# Patient Record
Sex: Male | Born: 1995 | Hispanic: Yes | Marital: Single
Health system: Midwestern US, Academic
[De-identification: ages and names within clinical notes are randomized; demographics above are authoritative.]

---

## 2017-07-20 ENCOUNTER — Encounter
Admit: 2017-07-20 | Discharge: 2017-07-20 | Payer: BC Managed Care – PPO | Primary: Student in an Organized Health Care Education/Training Program

## 2017-08-11 ENCOUNTER — Ambulatory Visit
Admit: 2017-08-11 | Discharge: 2017-08-12 | Payer: BC Managed Care – PPO | Primary: Student in an Organized Health Care Education/Training Program

## 2017-08-11 ENCOUNTER — Ambulatory Visit
Admit: 2017-08-11 | Discharge: 2017-08-11 | Payer: BC Managed Care – PPO | Primary: Student in an Organized Health Care Education/Training Program

## 2017-08-11 ENCOUNTER — Encounter
Admit: 2017-08-11 | Discharge: 2017-08-11 | Payer: BC Managed Care – PPO | Primary: Student in an Organized Health Care Education/Training Program

## 2017-08-11 DIAGNOSIS — Z7689 Persons encountering health services in other specified circumstances: Principal | ICD-10-CM

## 2017-08-11 DIAGNOSIS — I10 Essential (primary) hypertension: Principal | ICD-10-CM

## 2017-08-11 DIAGNOSIS — H9313 Tinnitus, bilateral: ICD-10-CM

## 2017-08-11 DIAGNOSIS — R222 Localized swelling, mass and lump, trunk: ICD-10-CM

## 2017-08-11 DIAGNOSIS — F419 Anxiety disorder, unspecified: ICD-10-CM

## 2017-08-11 LAB — COMPREHENSIVE METABOLIC PANEL
Lab: 0.4 mg/dL (ref 0.3–1.2)
Lab: 0.7 mg/dL (ref 0.4–1.24)
Lab: 10 mg/dL (ref 8.5–10.6)
Lab: 106 MMOL/L (ref 98–110)
Lab: 138 MMOL/L (ref 137–147)
Lab: 17 mg/dL (ref 7–25)
Lab: 4 MMOL/L (ref 3.5–5.1)
Lab: 7.9 g/dL (ref 6.0–8.0)
Lab: 97 mg/dL (ref 70–100)
Lab: >60 mL/min (ref >60)
Lab: >60 mL/min (ref >60)
Lab: 8 (ref 3–12)

## 2017-08-11 NOTE — Progress Notes
Date of Service: 08/11/2017    Subjective:             Paul Hale is a 22 y.o. male.    History of Present Illness  Paul Hale present to clinic to establish care.  Patient states he was previously seen at [facility] and is moving care to [place] to be closer to home.     Multiple Current concerns:  Patient states he had a paint can fall on his head about 2 months ago.  He states he was evaluated at [facility] after the accident.  He states he's had concussive symptoms inclduing lightheadedness and dizziness since the episode.  He states he is currently undergoing vestibular rehab which has mildly improved his symptoms. He denies any acute episodes of syncope, nausea, vomiting, or intractable headaches.  Patient sates he;s also noticed a lump in his R chest area.  He denies any surrounding edema or erythema.  He states he noticed the lump a couple of months ago.  He states he occasionally notices it causing some discomfort with movement of his R upper extremity.  He denies any known injury to his chest wall or upper extremity. He denies any known history of breast cancer in the family.    Past Medical History:   Diagnosis Date   ??? Hypertension      Past Surgical History:   Procedure Laterality Date   ??? HX SEPTOPLASTY  07/2016     Family History   Problem Relation Age of Onset   ??? Hypertension Mother    ??? Hypertension Father    ??? Diabetes Father    ??? Heart Disease Father      Social History     Social History   ??? Marital status: Single     Spouse name: N/A   ??? Number of children: N/A   ??? Years of education: N/A     Occupational History   ???  [employer]     Social History Main Topics   ??? Smoking status: Former Smoker     Types: Cigarettes     Quit date: 03/2015   ??? Smokeless tobacco: Never Used   ??? Alcohol use 3.6 oz/week     6 Cans of beer per week   ??? Drug use: No   ??? Sexual activity: Yes     Partners: Female     Other Topics Concern   ??? Not on file     Social History Narrative   ??? No narrative on file          Review of Systems Constitutional: Negative for chills, fatigue, fever and unexpected weight change.   HENT: Positive for tinnitus (chronic, high pitched). Negative for congestion, rhinorrhea and sore throat. Hearing loss: Patinet unsure but is concerned about hearing loss.    Eyes: Negative for pain and visual disturbance.   Respiratory: Negative for cough, chest tightness and shortness of breath.    Cardiovascular: Negative for chest pain and palpitations.   Gastrointestinal: Negative for abdominal pain, constipation, diarrhea, nausea and vomiting.   Endocrine: Negative for polydipsia and polyuria.   Genitourinary: Negative for dysuria, frequency and urgency.   Musculoskeletal: Negative for arthralgias, back pain, myalgias and neck pain.   Skin: Negative for rash and wound.   Neurological: Positive for dizziness and light-headedness. Negative for syncope, weakness and headaches.   Hematological: Does not bruise/bleed easily.   Psychiatric/Behavioral: Positive for dysphoric mood. Negative for self-injury and suicidal ideas. The patient is nervous/anxious.  Objective:         ??? hydroCHLOROthiazide (HYDRODIURIL) 25 mg tablet Take 25 mg by mouth every morning.     Vitals:    08/11/17 0852 08/11/17 0947   BP: 154/75 148/86   Pulse: 80 76   Resp: 14    Temp: 36.7 ???C (98.1 ???F)    TempSrc: Oral    SpO2: 100%    Weight: 93.8 kg (206 lb 12.8 oz)    Height: 177.8 cm (70)      Body mass index is 29.67 kg/m???.     Physical Exam   Constitutional: He is oriented to person, place, and time. He appears well-developed and well-nourished. No distress.   HENT:   Head: Normocephalic and atraumatic.   Right Ear: External ear normal.   Left Ear: External ear normal.   Nose: Nose normal.   Mouth/Throat: Oropharynx is clear and moist.   Rinne (air>bone) and webber (midline)    Eyes: Pupils are equal, round, and reactive to light. Conjunctivae and EOM are normal.   Neck: Normal range of motion. Neck supple. No thyromegaly present. Cardiovascular: Normal rate, regular rhythm, normal heart sounds and intact distal pulses.    No murmur heard.  Pulmonary/Chest: Effort normal and breath sounds normal. No respiratory distress. He has no wheezes. He exhibits no tenderness.   Small, non-tender fibrous bundle palpated on R chest at 10 o'clock region   Abdominal: Soft. Bowel sounds are normal. He exhibits no distension. There is no tenderness.   Musculoskeletal: Normal range of motion. He exhibits no edema or tenderness.   Neurological: He is alert and oriented to person, place, and time. No cranial nerve deficit.   Skin: Skin is warm and dry. No rash noted. He is not diaphoretic. No erythema.   Psychiatric: He has a normal mood and affect. His speech is normal and behavior is normal. Thought content normal.   PHQ 9 score: 15  GAD 7 score: 20   Vitals reviewed.       Assessment and Plan:    Paul Hale was seen today for establish care.    Diagnoses and all orders for this visit:    Encounter to establish care  - 21 y/o M w/ a PMH of hypertension presents to establish care  - PMH, PSH, FH, SH, Medications, and allergies reviewed and updated  - Will request records from previous provider    Essential hypertension  - Elevated BP in clinic today, patient reports not taking medication this morning  - Counseled on importance of BP control and medication compliance  - Counseled on benefit of DASH diet and weight loss  - RTC in 2 weeks for BP check.  Warning signs and return precautions for hypertension discussed  -     COMPREHENSIVE METABOLIC PANEL; Future; Expected date: 08/11/2017    Bilateral tinnitus  -Patient reported concerns for hearing loss and bilateral tinnitus  -Rinne and Weber test unremarkable in clinic  -Patient works on the [employer] at [employer], high occupational risk for hearing concerns  -Referral to audiology for hearing test  -     AMB REFERRAL TO AUDIOLOGY    Mass of right chest wall - Unclear cause of mass noted on physical exam,  ddx includes isolated lymphadenopathy, tumor, pectoral muscle injury  - [place] chest wall ordered for further evaluation  -     US EXAM CHEST (WALL AND UB); Future; Expected date: 08/11/2017    Anxiety and depression  -  Concerning PHQ 9 and gad 7 scores  - Brief counseling in clinic for anxiety and depression, recommended patient return to clinic in 2 weeks to further discuss treatment options  - We will review records in the meantime to determine if previous attempts were made for treatment  - Warning signs and return precautions for increasing anxiety or depression discussed    Patient discussed with Dr. Harvest Dark, MD  Family Medicine, PGY-3  Pager *[phone]

## 2017-08-22 NOTE — Progress Notes
I have reviewed the case with Dr. Majid at the time of the visit and agree with the evaluation and plan as documented by the resident.

## 2017-08-28 ENCOUNTER — Ambulatory Visit
Admit: 2017-08-28 | Discharge: 2017-08-29 | Payer: BC Managed Care – PPO | Primary: Student in an Organized Health Care Education/Training Program

## 2017-08-29 DIAGNOSIS — R222 Localized swelling, mass and lump, trunk: Principal | ICD-10-CM

## 2017-08-30 ENCOUNTER — Encounter
Admit: 2017-08-30 | Discharge: 2017-08-30 | Payer: BC Managed Care – PPO | Primary: Student in an Organized Health Care Education/Training Program

## 2017-08-30 DIAGNOSIS — R222 Localized swelling, mass and lump, trunk: Principal | ICD-10-CM

## 2017-09-05 ENCOUNTER — Encounter
Admit: 2017-09-05 | Discharge: 2017-09-05 | Payer: BC Managed Care – PPO | Primary: Student in an Organized Health Care Education/Training Program

## 2017-09-05 ENCOUNTER — Ambulatory Visit
Admit: 2017-09-05 | Discharge: 2017-09-05 | Payer: BC Managed Care – PPO | Primary: Student in an Organized Health Care Education/Training Program

## 2017-09-05 DIAGNOSIS — I10 Essential (primary) hypertension: Principal | ICD-10-CM

## 2017-09-05 DIAGNOSIS — F419 Anxiety disorder, unspecified: Principal | ICD-10-CM

## 2017-09-05 NOTE — Progress Notes
Date of Service: 09/05/2017    Subjective:             Paul Hale is a 21 y.o. male.    History of Present Illness  Paul Hale presents to clinic for follow up.    He was last seen in clinic on 11/8.  Patient states he continues to have symptoms of anxiety and depression.  He states he finds himself worrying about various life stressors.  He states he feels anxious about most daily activities. He states his anxiety affects his ability to complete tasks, and he feels as if there's never enough time in the day.  He denies any negative thoughts about himself or thoughts/ attempts to harm himself or others.  He states he also has difficulty falling asleep.  He states he usually gets to bed by 10 but does not fall asleep until 11:30-12.  He and his girlfriend in the room deny any concerns for apnea or snoring.  He states he usually feels tired most days which also does not help his ability to complete tasks.  He states he would be interested in talking to a counselor about his symptoms, but he states he is not interested in starting medications. He denies any other acute concerns today.    Past Medical History:   Diagnosis Date   ??? Hypertension      Social History     Social History   ??? Marital status: Single     Spouse name: N/A   ??? Number of children: N/A   ??? Years of education: N/A     Occupational History   ???  [employer]     Social History Main Topics   ??? Smoking status: Former Smoker     Types: Cigarettes     Quit date: 03/2015   ??? Smokeless tobacco: Never Used   ??? Alcohol use 3.6 oz/week     6 Cans of beer per week   ??? Drug use: No   ??? Sexual activity: Yes     Partners: Female     Other Topics Concern   ??? Not on file     Social History Narrative   ??? No narrative on file     Family History   Problem Relation Age of Onset   ??? Hypertension Mother    ??? Hypertension Father    ??? Diabetes Father    ??? Heart Disease Father         Review of Systems   Constitutional: Negative for chills, fatigue, fever and unexpected weight change.   Respiratory: Negative for cough, chest tightness and shortness of breath.    Cardiovascular: Negative for chest pain and palpitations.   Gastrointestinal: Negative for abdominal pain, constipation, diarrhea, nausea and vomiting.   Neurological: Negative for syncope and headaches.   Psychiatric/Behavioral: Positive for decreased concentration, dysphoric mood and sleep disturbance. Negative for agitation, behavioral problems, confusion, hallucinations, self-injury and suicidal ideas. The patient is nervous/anxious.          Objective:         ??? hydroCHLOROthiazide (HYDRODIURIL) 25 mg tablet Take 25 mg by mouth every morning.     Vitals:    09/05/17 1052   BP: 133/74   Pulse: 89   Resp: 16   SpO2: 98%   Weight: 94.8 kg (209 lb)   Height: 177.8 cm (70)     Body mass index is 29.99 kg/m???.     Physical Exam   Constitutional: He is oriented  to person, place, and time. He appears well-developed and well-nourished. No distress.   HENT:   Head: Normocephalic and atraumatic.   Mouth/Throat: Oropharynx is clear and moist.   Cardiovascular: Normal rate, regular rhythm, normal heart sounds and intact distal pulses.    No murmur heard.  Pulmonary/Chest: Effort normal and breath sounds normal. No respiratory distress. He exhibits no tenderness.   Neurological: He is alert and oriented to person, place, and time. No sensory deficit. He exhibits normal muscle tone.   Skin: He is not diaphoretic.   Psychiatric: His speech is normal and behavior is normal. Thought content normal. His mood appears anxious. His affect is not angry and not inappropriate. He does not exhibit a depressed mood. He expresses no homicidal and no suicidal ideation. He expresses no suicidal plans and no homicidal plans.   PHQ 9 score: 20  GAD 7 score: 17   Vitals reviewed.       Assessment and Plan:    [name] was seen today for follow up.    Diagnoses and all orders for this visit:    Anxiety and depression - Chronic condition, noted during previous OV as well  - Patient interested in counseling, referred to [facility]  - Brief counseling in clinic today, recommended keeping journal of anxiety triggers  - Counseled on sleep hygiene and warning signs for apnea  - Although formal questionnaire was not completed, symptoms are concerning for adult ADHD leading to his anxiety and depression.  Recommend formal evaluation at either next visit or by [facility]  - Warning signs and return precautions for significant anxiety and depression discussed  -     AMB REFERRAL TO BEHAVIORAL HEALTH    Essential hypertension  - BP wnl in clinic today, previously elevated at last clinic appointment.  Goal < 140/90  - Continue HCTZ at current dose    Patient discussed with Dr. Oliver Barre, MD  Family Medicine, PGY-3  Pager *[phone]

## 2017-09-07 NOTE — Progress Notes
Attestation:    See resident note for details of Paul RungSaul Cargle's visit. The case was reviewed with the resident at the time of the visit. I agree with the care as documented.    Staff Name: Raford Pitcherahira Layia Walla, MD             Time: September 07, 2017

## 2017-09-20 ENCOUNTER — Encounter
Admit: 2017-09-20 | Discharge: 2017-09-20 | Payer: BC Managed Care – PPO | Primary: Student in an Organized Health Care Education/Training Program

## 2017-10-17 ENCOUNTER — Ambulatory Visit
Admit: 2017-10-17 | Discharge: 2017-10-17 | Payer: BC Managed Care – PPO | Primary: Student in an Organized Health Care Education/Training Program

## 2017-10-17 ENCOUNTER — Encounter
Admit: 2017-10-17 | Discharge: 2017-10-17 | Payer: BC Managed Care – PPO | Primary: Student in an Organized Health Care Education/Training Program

## 2017-10-17 DIAGNOSIS — Q678 Other congenital deformities of chest: Principal | ICD-10-CM

## 2017-10-17 DIAGNOSIS — I10 Essential (primary) hypertension: Principal | ICD-10-CM

## 2018-01-12 ENCOUNTER — Encounter
Admit: 2018-01-12 | Discharge: 2018-01-12 | Payer: BC Managed Care – PPO | Primary: Student in an Organized Health Care Education/Training Program

## 2018-04-11 ENCOUNTER — Encounter
Admit: 2018-04-11 | Discharge: 2018-04-11 | Payer: BC Managed Care – PPO | Primary: Student in an Organized Health Care Education/Training Program

## 2018-07-24 ENCOUNTER — Encounter
Admit: 2018-07-24 | Discharge: 2018-07-24 | Payer: BC Managed Care – PPO | Primary: Student in an Organized Health Care Education/Training Program

## 2019-03-22 ENCOUNTER — Encounter: Admit: 2019-03-22 | Discharge: 2019-03-22 | Primary: Student in an Organized Health Care Education/Training Program

## 2019-06-18 ENCOUNTER — Encounter
Admit: 2019-06-18 | Discharge: 2019-06-18 | Payer: BC Managed Care – PPO | Primary: Student in an Organized Health Care Education/Training Program

## 2019-06-18 ENCOUNTER — Ambulatory Visit
Admit: 2019-06-18 | Discharge: 2019-06-19 | Payer: BC Managed Care – PPO | Primary: Student in an Organized Health Care Education/Training Program

## 2019-06-18 MED ORDER — SILDENAFIL 50 MG PO TAB
50 mg | ORAL_TABLET | ORAL | 0 refills | 25.00000 days | Status: AC | PRN
Start: 2019-06-18 — End: ?

## 2019-06-18 MED ORDER — HYDROCORTISONE 1 % TP CREA
Freq: Two times a day (BID) | TOPICAL | 0 refills | 30.00000 days | Status: AC
Start: 2019-06-18 — End: ?

## 2019-06-18 NOTE — Progress Notes
Subjective:       History of Present Illness  Paul Hale is a 23 y.o. male. Patient presenting to clinic for concern for yeast infection.    HPI  2 months ago, patient developed red polka-dots on the shaft and head with irritation and stimulation with a penis pump (for erectile dysfunction), these were present intermittently. Since that time, lesions have become persistent and developed a white pus and become odorous. Was seen at [facility] 1 month ago and given topical clotrimazole, tried this for 1 month, but this did not help. Girlfriend was diagnosed with a yeast infection.  Tested for STD's at [facility] before [facility] visit, negative.       Review of Systems   Constitutional: Negative for chills and fever.   Respiratory: Negative for shortness of breath.    Cardiovascular: Negative for chest pain.   Gastrointestinal: Negative for constipation, diarrhea, nausea and vomiting.   Genitourinary: Positive for dysuria, penile pain and penile swelling. Negative for discharge, flank pain, frequency, hematuria and scrotal swelling.   Neurological: Negative for dizziness and headaches.   Psychiatric/Behavioral: Negative for suicidal ideas. The patient is nervous/anxious.      Objective:         ? acetaminophen (TYLENOL PO) Take  by mouth.   ? hydrocortisone 1 % topical cream Apply  topically to affected area twice daily.   ? sildenafiL (VIAGRA) 50 mg tablet Take one tablet by mouth as Needed for Erectile dysfunction. Indications: the inability to have an erection     Vitals:    06/18/19 1454   BP: (!) 141/70   Pulse: 79   Resp: 16   SpO2: 100%   Weight: 84.4 kg (186 lb)   Height: 175.3 cm (69)     Body mass index is 27.47 kg/m?Marland Kitchen     Physical Exam  Constitutional:       General: He is not in acute distress.     Appearance: Normal appearance.   Cardiovascular:      Rate and Rhythm: Normal rate and regular rhythm.      Heart sounds: Normal heart sounds. No murmur. No friction rub. No gallop. Pulmonary:      Effort: No respiratory distress.   Abdominal:      General: Bowel sounds are normal.      Palpations: Abdomen is soft.      Tenderness: There is no abdominal tenderness.   Genitourinary:     Penis: Uncircumcised. No phimosis or paraphimosis.       Comments: Multiple pinpoint red non-tender, non-pruritic papules on glans  Musculoskeletal:         General: No swelling.   Skin:     General: Skin is warm and dry.   Neurological:      Mental Status: He is alert.   Psychiatric:         Mood and Affect: Mood is anxious.          Assessment and Plan:  Erectile dysfunction, unspecified erectile dysfunction type  Generalized Anxiety  -     AMB REFERRAL TO BEHAVIORAL HEALTH  -     sildenafiL (VIAGRA) 50 mg tablet; Take one tablet by mouth as Needed for Erectile dysfunction. Indications: the inability to have an erection  - Admits to anxiety but hasn't work with psychology in past    Penile lesion  - Appears to be capillaries vs inflammation, NOT concerned for balanitis, HSV, sphyilis or gonorrhea/chlamydia  - Symptoms appeared after  using penile pump for E.D.  - Failed Clotrimazole and unknown oral medication (Fluconazole?)  - Reviewed STI panel from OSH (negative), GF was negative as well  -     hydrocortisone 1 % topical cream; Apply  topically to affected area twice daily.  - Advised keeping area cool and dry, wearing looser underwear, avoiding penile pump    Patient seen and discussed with Dr. Suzi Roots.     Patient to return on as needed basis if symptoms worsen.    ATTESTATION    I personally performed or re-performed the history, physical exam and treatment plan for the E/M. I discussed the case with the Medical Student, and concur with the Medical Student documentation of history, physical exam and treatment plan unless otherwise noted.    Resident name:  Alonna Minium, DO Date:  06/18/2019

## 2019-07-04 ENCOUNTER — Encounter
Admit: 2019-07-04 | Discharge: 2019-07-04 | Payer: BC Managed Care – PPO | Primary: Student in an Organized Health Care Education/Training Program

## 2019-07-17 ENCOUNTER — Encounter
Admit: 2019-07-17 | Discharge: 2019-07-17 | Payer: BC Managed Care – PPO | Primary: Student in an Organized Health Care Education/Training Program

## 2019-07-17 NOTE — Telephone Encounter
Called pt when he did not present to telehealth session. Home number not in service, left voicemail on mobile number, included [facility] clinic phone contact if pt interested in scheduling services.

## 2020-07-04 ENCOUNTER — Encounter
Admit: 2020-07-04 | Discharge: 2020-07-04 | Payer: BC Managed Care – PPO | Primary: Student in an Organized Health Care Education/Training Program

## 2020-07-21 ENCOUNTER — Encounter
Admit: 2020-07-21 | Discharge: 2020-07-21 | Payer: BC Managed Care – PPO | Primary: Student in an Organized Health Care Education/Training Program

## 2020-07-21 NOTE — Telephone Encounter
RN called to find more information related to appointment with Dr. Jeanella Craze. Unable to reach patient. LVM for call back.

## 2020-07-22 ENCOUNTER — Encounter
Admit: 2020-07-22 | Discharge: 2020-07-22 | Payer: BC Managed Care – PPO | Primary: Student in an Organized Health Care Education/Training Program

## 2020-07-22 ENCOUNTER — Ambulatory Visit
Admit: 2020-07-22 | Discharge: 2020-07-22 | Payer: BC Managed Care – PPO | Primary: Student in an Organized Health Care Education/Training Program

## 2020-07-22 DIAGNOSIS — I10 Essential (primary) hypertension: Secondary | ICD-10-CM

## 2020-07-22 DIAGNOSIS — J310 Chronic rhinitis: Secondary | ICD-10-CM

## 2020-07-22 DIAGNOSIS — J029 Acute pharyngitis, unspecified: Secondary | ICD-10-CM

## 2020-07-22 MED ORDER — FLUTICASONE PROPIONATE 50 MCG/ACTUATION NA SPSN
2 | Freq: Every day | NASAL | 3 refills | 60.00000 days | Status: AC
Start: 2020-07-22 — End: ?

## 2020-07-22 NOTE — Patient Instructions
We discussed having you start an over the counter nasal steroid spray (Nasacort -triamcinolone or Flonase -fluticasone).  I recommend two sprays each nostril once day - typically the best time is 30 min prior to bedtime.   Remember to aim the nasal steroid spray towards the outer corner of your eye and sniff gently when spraying.  This spray can take 4-6 weeks of consistent use to have it's full effect.

## 2020-07-25 ENCOUNTER — Encounter
Admit: 2020-07-25 | Discharge: 2020-07-25 | Payer: BC Managed Care – PPO | Primary: Student in an Organized Health Care Education/Training Program

## 2020-07-25 DIAGNOSIS — I10 Essential (primary) hypertension: Secondary | ICD-10-CM

## 2021-01-20 ENCOUNTER — Encounter
Admit: 2021-01-20 | Discharge: 2021-01-20 | Payer: BC Managed Care – PPO | Primary: Student in an Organized Health Care Education/Training Program

## 2021-01-20 ENCOUNTER — Emergency Department: Admit: 2021-01-20 | Discharge: 2021-01-20 | Payer: BC Managed Care – PPO

## 2021-01-20 DIAGNOSIS — I10 Essential (primary) hypertension: Secondary | ICD-10-CM

## 2021-01-20 LAB — URINALYSIS DIPSTICK REFLEX TO CULTURE
Lab: NEGATIVE K/UL (ref 0–0.80)
Lab: NEGATIVE K/UL — ABNORMAL HIGH (ref 3–12)
Lab: NEGATIVE MMOL/L (ref 21–30)
Lab: NEGATIVE U/L (ref 7–40)
Lab: NEGATIVE U/L — ABNORMAL LOW (ref 25–110)
Lab: NEGATIVE g/dL — ABNORMAL HIGH (ref 3.5–5.0)
Lab: NEGATIVE mL/min (ref 1.0–4.8)

## 2021-01-20 LAB — CBC AND DIFF
Lab: 0 K/UL (ref 0–0.20)
Lab: 0 K/UL (ref 0–0.45)
Lab: 11 K/UL — ABNORMAL HIGH (ref 4.5–11.0)
Lab: 18 (ref <20.7)

## 2021-01-20 LAB — COMPREHENSIVE METABOLIC PANEL
Lab: 102 MMOL/L (ref 98–110)
Lab: 138 MMOL/L (ref 137–147)
Lab: 89 mg/dL (ref 70–100)

## 2021-01-20 LAB — POC LACTATE: Lab: 1.5 MMOL/L (ref 0.5–2.0)

## 2021-01-20 LAB — URINALYSIS MICROSCOPIC REFLEX TO CULTURE

## 2021-01-20 LAB — LIPASE: Lab: 17 U/L (ref 11–82)

## 2021-01-20 MED ORDER — ACETAMINOPHEN 325 MG PO TAB
650 mg | Freq: Once | ORAL | 0 refills | Status: CP
Start: 2021-01-20 — End: ?
  Administered 2021-01-21: 02:00:00 650 mg via ORAL

## 2021-01-20 MED ORDER — ACETAMINOPHEN/LIDOCAINE/ANTACID DS(#) 1:1:3  PO SUSP
30 mL | Freq: Once | ORAL | 0 refills | Status: CP
Start: 2021-01-20 — End: ?
  Administered 2021-01-21: 01:00:00 30 mL via ORAL

## 2021-01-20 MED ORDER — ACETAMINOPHEN 500 MG PO TAB
1000 mg | Freq: Once | ORAL | 0 refills | Status: DC
Start: 2021-01-20 — End: 2021-01-21

## 2021-01-20 MED ORDER — PANTOPRAZOLE 40 MG IV SOLR
40 mg | Freq: Once | INTRAVENOUS | 0 refills | Status: CP
Start: 2021-01-20 — End: ?
  Administered 2021-01-21: 02:00:00 40 mg via INTRAVENOUS

## 2021-01-20 MED ORDER — IOHEXOL 350 MG IODINE/ML IV SOLN
100 mL | Freq: Once | INTRAVENOUS | 0 refills | Status: CP
Start: 2021-01-20 — End: ?
  Administered 2021-01-21: 03:00:00 100 mL via INTRAVENOUS

## 2021-01-20 MED ORDER — SODIUM CHLORIDE 0.9 % IJ SOLN
50 mL | Freq: Once | INTRAVENOUS | 0 refills | Status: CP
Start: 2021-01-20 — End: ?
  Administered 2021-01-21: 03:00:00 50 mL via INTRAVENOUS

## 2021-01-20 NOTE — ED Notes
25 y.o. Male presents to ED results waiting due to CC of abdominal pain. Pt indicates that pain is in his upper abdominal area. Pt currently rates pain 10/10. Pt reportedly seen at [facility] for same CC. Pt reports that he was dx w/ "fluid" on his gallbladder. Pt ambulates w/ a steady gate and in NAD.

## 2021-01-23 ENCOUNTER — Encounter
Admit: 2021-01-23 | Discharge: 2021-01-23 | Payer: BC Managed Care – PPO | Primary: Student in an Organized Health Care Education/Training Program

## 2021-01-23 NOTE — Telephone Encounter
ED Discharge Follow Up  Reached patient: Yes  Admission Information  Hospital Name : [facility]  ED Admission Date: 01/20/21 ED Discharge Date: 01/21/21 Admission Diagnosis: Pain, abd  Discharge Diagnosis Abdominal pain, unspecified abdominal location  Hospital Services: Unplanned  Today's call is 2 (calendar) days post discharge    Discharge Instruction Review  Did patient receive and understand discharge instructions? Yes  Are there concerns regarding the patient?s ADL?s ? No    Medication Reconciliation  Changes to pre-ED visit medications? No  Were new prescriptions filled?N/A  Meds reviewed and reconciled? No  ? acetaminophen (TYLENOL PO) Take  by mouth.   ? fexofenadine (ALLEGRA) 180 mg tablet Take 180 mg by mouth daily.   ? fluticasone propionate (FLONASE ALLERGY RELIEF) 50 mcg/actuation nasal spray, suspension Apply two sprays to each nostril as directed daily. Shake bottle gently before using.   ? hydrocortisone 1 % topical cream Apply  topically to affected area twice daily.   ? sildenafiL (VIAGRA) 50 mg tablet Take one tablet by mouth as Needed for Erectile dysfunction. Indications: the inability to have an erection         Understanding Condition  Having any current symptoms? Yes, Pain/discomfort; he states he still has some abd pain but not as bad.  He denies any fever, N/V.  He requested to be transferred to [facility] for f/u appt, and will contact PCP office for appt at later time.  Patient understands when to seek additional medical care? Yes  Is patient aware of [facility] locations?  Urgent Care appropriate for this diagnosis? No  Other instructions provided:     Scheduling Follow-up Appointment  Upcoming appointment date and time and with whom scheduled: No future appointments.  When was patient?s last PCP visit: 06/18/2019  PCP primary location: [facility]  PCP appointment scheduled? No, will contact office at later time  Specialist appointment scheduled? No, transferred to [facility] for appt  Both PCP and Specialist appointment scheduled: No  Is assistance with transportation needed? No  MyChart message sent? Active in MyChart. No message sent.     ED Communication   Did Pt call Clinic prior to going to ED? No  Reason patient went to ED: Fear of having a critical medical condition    Paul Hale

## 2021-01-23 NOTE — Telephone Encounter
Noted.Will Follow As Needed.

## 2021-01-28 ENCOUNTER — Encounter
Admit: 2021-01-28 | Discharge: 2021-01-28 | Payer: BC Managed Care – PPO | Primary: Student in an Organized Health Care Education/Training Program

## 2021-01-28 ENCOUNTER — Ambulatory Visit
Admit: 2021-01-28 | Discharge: 2021-01-29 | Payer: BC Managed Care – PPO | Primary: Student in an Organized Health Care Education/Training Program

## 2021-01-28 DIAGNOSIS — K6289 Other specified diseases of anus and rectum: Secondary | ICD-10-CM

## 2021-01-28 DIAGNOSIS — F4310 Post-traumatic stress disorder, unspecified: Secondary | ICD-10-CM

## 2021-01-28 DIAGNOSIS — R1084 Generalized abdominal pain: Principal | ICD-10-CM

## 2021-01-28 DIAGNOSIS — I10 Essential (primary) hypertension: Secondary | ICD-10-CM

## 2021-01-28 MED ORDER — HYDROCORTISONE 2.5 % TP CRPE
Freq: Two times a day (BID) | TOPICAL | 1 refills | 30.00000 days | Status: AC
Start: 2021-01-28 — End: ?
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
## 2021-02-02 ENCOUNTER — Encounter
Admit: 2021-02-02 | Discharge: 2021-02-02 | Payer: BC Managed Care – PPO | Primary: Student in an Organized Health Care Education/Training Program

## 2021-02-02 ENCOUNTER — Ambulatory Visit
Admit: 2021-02-02 | Discharge: 2021-02-02 | Payer: BC Managed Care – PPO | Primary: Student in an Organized Health Care Education/Training Program

## 2021-02-02 DIAGNOSIS — K219 Gastro-esophageal reflux disease without esophagitis: Secondary | ICD-10-CM

## 2021-02-02 DIAGNOSIS — R1084 Generalized abdominal pain: Secondary | ICD-10-CM

## 2021-02-02 DIAGNOSIS — I10 Essential (primary) hypertension: Secondary | ICD-10-CM

## 2021-02-02 DIAGNOSIS — F419 Anxiety disorder, unspecified: Secondary | ICD-10-CM

## 2021-02-02 NOTE — Pre-Anesthesia Patient Instructions
UPPER GI ENDOSCOPY   INSTRUCTIONS      Paul Hale, Paul Hale are scheduled for an EGD on: 02/04/2021  at:  7:30 AM   With Dr. Ihor Gully, Dola Argyle, MD  You must arrive by:  6:30 AM     ** Please keep in mind that procedure times are subject to change**    OUR ADDRESS IS [address] [place], [place] [address]  WE ARE LOCATED AT JUST OFF [address]   WE ARE [address]  ONCE YOU'RE ON THE CAMPUS FOLLOW THE SIGNS TO THE SURGERY CENTER LOCATED ON THE EAST SIDE OF THE CAMPUS PARK AND ENTER THROUGH THE SURGERY CENTER DOORS.    To Reschedule call: [phone]  For Questions call: [phone]    ____________________________________________________________    Upper GI endoscopy allows healthcare providers to look directly into the beginning of your gastrointestinal(GI) tract.  The esophagus, stomach, and duodenum (first part of the small intestine) make up the upper GI tract.  _____________________________________________________________________________________________________    Starting Now:  Contact your provider who prescribes any of the following to develop a plan for surgery:  Blood thinners such as aspirin, Aggrenox, Brilinta, Effient, Eliquis, enoxaparin (Lovenox), clopidogrel (Plavix), cilostazol, pentoxifylline (Trental), Pradaxa, Savaysa, ticlopidine, Xarelto, and warfarin (Coumadin)  Do not give yourself a Lovenox injection the morning of the test.  Lovenox injections may be taken as usual the day before the test.  Immunosuppresants such as methotrexate, azathioprine, sulfasalazine, everolimus, sirolimus, Humira, Remicade, Enbrel, Simponi, Orencia, Cimzia, Actemra, and Xeljanz  Chemotherapy  CONTACT YOUR PROVIDER prior to stopping your blood thinner    Hold until after your procedure:  Stop most vitamins, herbals, and supplements including (but not limited to):  Iron, Alpha lipoic acid, black cohosh, CoQ10, echinacea, eye vitamins, fish oil, flaxseed oil, garlic, gingko biloba, ginseng, glucosamine/chondroitin, kava, Lovaza, lutein, lysine, multivitamin, red yeast rice, SAM-e, saw palmetto, St. John's wort, turmeric, valerian root, Vascepa, Vitamin A, Vitamin B complex, Vitamin C, Vitamin E  You DO NOT need to stop: magnesium, potassium    7 days prior to surgery:  Stop anti-inflammatory medications such as ibuprofen (Advil, Motrin), naproxen (Aleve), Alka-Seltzer, Excedrin, Midol, celecoxib (Celebrex), diclofenac (Voltaren), diflunisal, etodolac, flurbiprofen, indomethacin, ketoprofen, ketorolac, meloxicam, nabumetone, and piroxicam    DAY OF TEST:  You should have nothing to eat after midnight prior to your procedure.  You may drink clear liquids up until (4) hours before your scheduled procedure time. After this you should have nothing by mouth. This includes GUM or CANDY. Chewing tobacco must be stopped (6) hours before your scheduled procedure.     Clear Liquid Diet   *Water  *Gatorade or sports drink     *White Grape Juice    *Coffee or Tea without Cream    *Apple Juice     *Soda Pop (all are ok)   *Popsicles   *Bouillon or Broth (No Noodles)    Take off any jewelry, take out any piercings, leave all valuables at home.  If you wear glasses or contacts, please bring a case for their safekeeping.   You will be sedated for the procedure. A responsible adult must drive you home (no Benedetto Goad, taxis or buses are allowed) and we ask that your ride stay at the facility until your procedure is finished. If you do not have a driver we will be unable to do the test.  Please plan to be here approximately 2 hours and encourage  your driver to be within 20 minutes of the facility.   You will not be able to return to work the same day if you have received sedation.     8. Please bring a list of your current medications and dosages with you.    9. Please bring your ID and insurance card along with any required copay/deductible.  10. Please wear a mask and have your driver do the same (it does not have to be medical grade).    YOUR  MEDICATION INSTRUCTIONS FOR SURGERY:    Before surgery     Please follow these instructions regarding your insulin:  Only take half of your normal dose of your long acting insulin the night before procedure  Do not give yourself insulin the morning of procedure  If you have an insulin pump:               1) Keep basil rate              2) Eliminate bolus doses    Morning of surgery  On the morning of surgery, do NOT take these medications:  Insulin/diabetic meds  Ointments/creams/lotions  ACE/ARB meds  Water pills    On the morning of surgery, take ONLY these medications with a sip (1-2 ounces) of water:  Beta blockers/calcium channel blockers  Scheduled pain medications  Acid reducers  May take thyroid medications      KEEPING YOU SAFE    ** THE HEALTH AND SAFETY OF OUR PATIENTS, STAFF AND PHYSICIANS IS OUR TOP PRIORITY. TO REDUCE THE RISK OF POSSIBLE COVID-19 EXPOSURE WE ARE TAKING THE PROPER PRECAUTIONS LISTED BELOW    YOU ARE PERMITTED 1 VISITOR IN THE WAITING AREA.    ALL PATIENTS AND THEIR RESPONSIBLE ADULT (IF ENTERING THE FACILITY) WILL BE SCREENED ON THE DATE OF SERVICE UPON ARRIVAL AND BE REQUIRED TO WEAR A MASK WHILE INSIDE THE FACILITY.     NOTIFY YOUR SURGEON IF YOU HAVE ANY SIGNIFICANT HEALTH STATUS CHANGES OR SHOULD YOU BECOME ILL PRIOR TO SURGERY WITH FEVER (TEMP 100.4 FAHRENHEIT OR GREATER) AND/OR COUGH, DIFFICULTY BREATHING, CHILLS, MUSCLE PAIN, HEADACHE, SORE THROAT OR NEW ONSET LOSS OF TASTE OR SMELL, OR NEW SUSPECTED EXPOSURE TO COVID-19.    ___________________________________________________________________________________                      Pre-Admissions Testing RN  [facility]  [email or web address]  [address]   [place], [place] [address]  [phone])  [email or web address]  An Affiliate of [facility]

## 2021-02-03 ENCOUNTER — Encounter
Admit: 2021-02-03 | Discharge: 2021-02-03 | Payer: BC Managed Care – PPO | Primary: Student in an Organized Health Care Education/Training Program

## 2021-02-04 ENCOUNTER — Encounter
Admit: 2021-02-04 | Discharge: 2021-02-04 | Payer: BC Managed Care – PPO | Primary: Student in an Organized Health Care Education/Training Program

## 2021-02-04 DIAGNOSIS — K219 Gastro-esophageal reflux disease without esophagitis: Secondary | ICD-10-CM

## 2021-02-04 DIAGNOSIS — I10 Essential (primary) hypertension: Secondary | ICD-10-CM

## 2021-02-04 MED ORDER — PROPOFOL INJ 10 MG/ML IV VIAL
INTRAVENOUS | 0 refills | Status: DC
Start: 2021-02-04 — End: 2021-02-04

## 2021-02-04 MED ORDER — LIDOCAINE (PF) 100 MG/5 ML (2 %) IV SYRG
INTRAVENOUS | 0 refills | Status: DC
Start: 2021-02-04 — End: 2021-02-04

## 2021-02-04 MED ORDER — PROPOFOL 10 MG/ML IV EMUL 20 ML (INFUSION)(AM)(OR)
INTRAVENOUS | 0 refills | Status: DC
Start: 2021-02-04 — End: 2021-02-04

## 2021-02-04 MED ORDER — GLYCOPYRROLATE 0.2 MG/ML IJ SOLN
INTRAVENOUS | 0 refills | Status: DC
Start: 2021-02-04 — End: 2021-02-04

## 2021-02-05 ENCOUNTER — Encounter
Admit: 2021-02-05 | Discharge: 2021-02-05 | Payer: BC Managed Care – PPO | Primary: Student in an Organized Health Care Education/Training Program

## 2021-02-05 DIAGNOSIS — K219 Gastro-esophageal reflux disease without esophagitis: Secondary | ICD-10-CM

## 2021-02-05 DIAGNOSIS — I10 Essential (primary) hypertension: Secondary | ICD-10-CM

## 2021-02-09 ENCOUNTER — Encounter
Admit: 2021-02-09 | Discharge: 2021-02-09 | Payer: BC Managed Care – PPO | Primary: Student in an Organized Health Care Education/Training Program

## 2021-04-03 ENCOUNTER — Encounter: Admit: 2021-04-03 | Discharge: 2021-04-03 | Payer: BC Managed Care – PPO

## 2021-04-28 ENCOUNTER — Encounter: Admit: 2021-04-28 | Discharge: 2021-04-28 | Payer: BC Managed Care – PPO

## 2021-04-29 ENCOUNTER — Encounter: Admit: 2021-04-29 | Discharge: 2021-04-29 | Payer: BC Managed Care – PPO

## 2021-08-03 ENCOUNTER — Emergency Department: Admit: 2021-08-03 | Discharge: 2021-08-03 | Payer: BC Managed Care – PPO

## 2021-08-03 ENCOUNTER — Encounter: Admit: 2021-08-03 | Discharge: 2021-08-03 | Payer: BC Managed Care – PPO

## 2021-08-03 DIAGNOSIS — I10 Essential (primary) hypertension: Secondary | ICD-10-CM

## 2021-08-03 DIAGNOSIS — K219 Gastro-esophageal reflux disease without esophagitis: Secondary | ICD-10-CM

## 2021-08-03 LAB — COMPREHENSIVE METABOLIC PANEL
ALBUMIN: 5 g/dL (ref 3.5–5.0)
ALK PHOSPHATASE: 64 U/L (ref 25–110)
ALT: 20 U/L (ref 7–56)
ANION GAP: 13 K/UL — ABNORMAL HIGH (ref 3–12)
AST: 18 U/L (ref 7–40)
CALCIUM: 9.8 mg/dL (ref 8.5–10.6)
CHLORIDE: 103 MMOL/L (ref 98–110)
CO2: 24 MMOL/L (ref 21–30)
CREATININE: 0.8 mg/dL (ref 0.4–1.24)
EGFR: >60 mL/min (ref >60)
SODIUM: 140 MMOL/L (ref 137–147)
TOTAL BILIRUBIN: 0.7 mg/dL (ref 0.3–1.2)
TOTAL PROTEIN: 8.1 g/dL — ABNORMAL HIGH (ref 6.0–8.0)

## 2021-08-03 LAB — INFLUENZA A/B AND RSV PCR
FLU A: NEGATIVE
FLU B: NEGATIVE
RSV: NEGATIVE

## 2021-08-03 LAB — CBC AND DIFF
ABSOLUTE EOS COUNT: 0 K/UL (ref 0–0.45)
ABSOLUTE MONO COUNT: 0.4 K/UL (ref 0–0.80)
WBC COUNT: 5.8 K/UL (ref 4.5–11.0)

## 2021-08-03 LAB — VITAMIN B12: VITAMIN B12: 599 pg/mL (ref 180–914)

## 2021-08-03 LAB — COVID-19 (SARS-COV-2) PCR

## 2021-08-03 LAB — TSH WITH FREE T4 REFLEX: TSH: 1.4 uU/mL (ref 0.35–5.00)

## 2021-08-03 LAB — MAGNESIUM: MAGNESIUM: 2 mg/dL (ref 1.6–2.6)

## 2021-08-03 MED ORDER — ONDANSETRON HCL (PF) 4 MG/2 ML IJ SOLN
4 mg | Freq: Once | INTRAVENOUS | 0 refills | Status: CP
Start: 2021-08-03 — End: ?
  Administered 2021-08-04: 04:00:00 4 mg via INTRAVENOUS

## 2021-08-03 MED ORDER — KETOROLAC 15 MG/ML IJ SOLN
15 mg | Freq: Once | INTRAVENOUS | 0 refills | Status: AC
Start: 2021-08-03 — End: ?
  Administered 2021-08-04: 06:00:00 15 mg via INTRAVENOUS

## 2021-08-03 MED ORDER — PROCHLORPERAZINE EDISYLATE 5 MG/ML IJ SOLN
10 mg | Freq: Once | INTRAVENOUS | 0 refills | Status: AC
Start: 2021-08-03 — End: ?
  Administered 2021-08-04: 06:00:00 10 mg via INTRAVENOUS

## 2021-08-03 MED ORDER — MECLIZINE 25 MG PO TAB
50 mg | Freq: Once | ORAL | 0 refills | Status: AC
Start: 2021-08-03 — End: ?
  Administered 2021-08-04: 06:00:00 50 mg via ORAL

## 2021-08-03 MED ORDER — LACTATED RINGERS IV SOLP
1000 mL | Freq: Once | INTRAVENOUS | 0 refills | Status: CP
Start: 2021-08-03 — End: ?
  Administered 2021-08-04: 04:00:00 1000 mL via INTRAVENOUS

## 2021-08-03 MED ORDER — FLUTICASONE PROPIONATE 50 MCG/ACTUATION NA SPSN
2 | Freq: Every day | NASAL | 0 refills | Status: AC
Start: 2021-08-03 — End: ?

## 2021-08-03 MED ORDER — DIPHENHYDRAMINE HCL 50 MG/ML IJ SOLN
25 mg | Freq: Once | INTRAVENOUS | 0 refills | Status: AC
Start: 2021-08-03 — End: ?

## 2021-08-04 MED ADMIN — FLUTICASONE PROPIONATE 50 MCG/ACTUATION NA SPSN [70536]: 2 | NASAL | @ 06:00:00 | Stop: 2021-08-04 | NDC 60505082901

## 2021-08-04 NOTE — ED Notes
Patient came into the ED due to increasing dizziness. Patient reports 'it started last week with these pains on the Right top side of my head. It's starting to affect my school and concentrating. I start to get dizzy when I sit or stand and it will just come and go. I also started noticing this clear thin drainage that sometimes goes down the back of my throat. School has been stressful and all of this started last week when I had a big final'. Patient denies chest pain and SOB. Bed is in lowest locked position, call light and belongings are within reach.     ;hx   Medical History:   Diagnosis Date    Acid reflux     Hypertension

## 2021-08-06 ENCOUNTER — Encounter: Admit: 2021-08-06 | Discharge: 2021-08-06

## 2021-08-06 NOTE — Telephone Encounter
ED Discharge Follow Up  Reached patient: Seen in clinic 24-48 hours post discharge; scheduled with PCP office 08/07/21  Admission Information  Hospital Name : [facility]  ED Admission Date: 08/03/21 ED Discharge Date: 08/04/21 Admission Diagnosis: Dizziness  Discharge Diagnosis   Nonintractable headache, unspecified chronicity pattern, unspecified headache type   Nausea   Dizziness   Anxiety   Elevated blood pressure reading   Sinus pressure   Hospital Services: Unplanned  Today's call is 2 (calendar) days post discharge    Medication Reconciliation  Changes to pre-ED visit medications? No  Were new prescriptions filled?N/A  Meds reviewed and reconciled? Yes  ? acetaminophen (TYLENOL PO) Take  by mouth.   ? fexofenadine (ALLEGRA) 180 mg tablet Take 180 mg by mouth daily.   ? fluticasone propionate (FLONASE ALLERGY RELIEF) 50 mcg/actuation nasal spray, suspension Apply two sprays to each nostril as directed daily. Shake bottle gently before using.   ? hydrocortisone (PROCTOZONE-HC) 2.5 % rectal cream Apply  topically to affected area twice daily.   ? hydrOXYzine HCL (ATARAX) 25 mg tablet Take two tablets by mouth three times daily as needed for Anxiety.   ? meclizine (TRAVEL-EASE (MECLIZINE)) 25 mg tablet Take one tablet by mouth three times daily as needed.   ? pantoprazole DR (PROTONIX) 20 mg tablet Take  by mouth daily.   ? sildenafiL (VIAGRA) 50 mg tablet Take one tablet by mouth as Needed for Erectile dysfunction. Indications: the inability to have an erection   ? sucralfate (CARAFATE) 1 gram tablet TAKE 1 TABLET BY MOUTH 4 TIMES DAILY ON AN EMPTY STOMACH ( 1 HOUR BEFORE MEALS AND AT BEDTIME)       Scheduling Follow-up Appointment  Upcoming appointment date and time and with whom scheduled:   Future Appointments   Date Time Provider Department Center   08/07/2021  1:00 PM Dorian Heckle, MD [facility]     When was patient?s last PCP visit: Visit date not found  PCP primary location: [facility]  PCP appointment scheduled? Yes, Date: 08/07/21   Specialist appointment scheduled? No  Both PCP and Specialist appointment scheduled: No  Is assistance with transportation needed? No  MyChart message sent? Active in MyChart. No message sent.     ED Communication   Did Pt call Clinic prior to going to ED? No      Lavell Islam

## 2021-08-06 NOTE — Telephone Encounter
Noted.Will Follow As Needed.

## 2021-08-07 ENCOUNTER — Encounter: Admit: 2021-08-07 | Discharge: 2021-08-07

## 2021-08-07 ENCOUNTER — Ambulatory Visit: Admit: 2021-08-07 | Discharge: 2021-08-07

## 2021-08-07 DIAGNOSIS — I10 Essential (primary) hypertension: Secondary | ICD-10-CM

## 2021-08-07 DIAGNOSIS — R42 Dizziness and giddiness: Secondary | ICD-10-CM

## 2021-08-07 DIAGNOSIS — F419 Anxiety disorder, unspecified: Secondary | ICD-10-CM

## 2021-08-07 DIAGNOSIS — K219 Gastro-esophageal reflux disease without esophagitis: Secondary | ICD-10-CM

## 2021-08-07 MED ORDER — SERTRALINE 25 MG PO TAB
25 mg | ORAL_TABLET | Freq: Every day | ORAL | 0 refills | Status: AC
Start: 2021-08-07 — End: ?
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
## 2021-08-07 NOTE — Progress Notes
Attending Attestation  I personally performed the key portions of the E/M visit, discussed case with resident (Kathryn O'Brien, MD) and concur with the resident documentation of history, physical exam, assessment, and treatment plan unless otherwise noted.    Lilliahna Schubring, DO  Assistant Professor of Family Medicine  [facility]  Available on Voalte

## 2021-08-13 ENCOUNTER — Encounter: Admit: 2021-08-13 | Discharge: 2021-08-13

## 2021-08-13 NOTE — Telephone Encounter
Was unable to reach pt, left a vm on his phone.

## 2021-08-14 ENCOUNTER — Encounter: Admit: 2021-08-14 | Discharge: 2021-08-14

## 2021-08-17 ENCOUNTER — Encounter: Admit: 2021-08-17 | Discharge: 2021-08-17

## 2021-08-17 ENCOUNTER — Ambulatory Visit: Admit: 2021-08-17 | Discharge: 2021-08-17

## 2021-08-17 DIAGNOSIS — R42 Dizziness and giddiness: Secondary | ICD-10-CM

## 2021-08-17 DIAGNOSIS — K219 Gastro-esophageal reflux disease without esophagitis: Secondary | ICD-10-CM

## 2021-08-17 DIAGNOSIS — G43719 Chronic migraine without aura, intractable, without status migrainosus: Secondary | ICD-10-CM

## 2021-08-17 DIAGNOSIS — I10 Essential (primary) hypertension: Secondary | ICD-10-CM

## 2021-08-17 MED ORDER — PROPRANOLOL 20 MG PO TAB
20 mg | ORAL_TABLET | Freq: Two times a day (BID) | ORAL | 5 refills | Status: AC
Start: 2021-08-17 — End: ?

## 2021-10-07 ENCOUNTER — Encounter: Admit: 2021-10-07 | Discharge: 2021-10-07 | Payer: Medicaid Other

## 2021-10-09 ENCOUNTER — Encounter: Admit: 2021-10-09 | Discharge: 2021-10-09 | Payer: Medicaid Other

## 2021-10-09 NOTE — Telephone Encounter
Pt is requesting to know if his referral to neurology can be sent somewhere else. States it is a long time to get into [facility]. LVM [id number]

## 2021-10-12 ENCOUNTER — Encounter: Admit: 2021-10-12 | Discharge: 2021-10-12 | Payer: Medicaid Other

## 2021-10-16 ENCOUNTER — Encounter: Admit: 2021-10-16 | Discharge: 2021-10-16 | Payer: Medicaid Other

## 2021-10-16 NOTE — Telephone Encounter
Pt. requesting the referral for neurology be sent to the fax number he has. Requesting a call back. LVM [id number]

## 2022-03-30 ENCOUNTER — Encounter: Admit: 2022-03-30 | Discharge: 2022-03-30 | Payer: Medicaid Other

## 2022-03-30 NOTE — Telephone Encounter
Pt. requesting a call back in regards to a referral. LVM [id number]

## 2022-04-29 ENCOUNTER — Encounter: Admit: 2022-04-29 | Discharge: 2022-04-29 | Payer: Medicaid Other

## 2022-07-14 ENCOUNTER — Encounter: Admit: 2022-07-14 | Discharge: 2022-07-14 | Payer: Medicaid Other

## 2023-06-06 ENCOUNTER — Emergency Department: Admit: 2023-06-06 | Discharge: 2023-06-06 | Payer: Medicaid Other

## 2023-06-06 ENCOUNTER — Encounter: Admit: 2023-06-06 | Discharge: 2023-06-06 | Payer: Medicaid Other

## 2023-06-06 DIAGNOSIS — K5900 Constipation, unspecified: Secondary | ICD-10-CM

## 2023-06-06 DIAGNOSIS — I10 Essential (primary) hypertension: Secondary | ICD-10-CM

## 2023-06-06 DIAGNOSIS — R109 Unspecified abdominal pain: Secondary | ICD-10-CM

## 2023-06-06 DIAGNOSIS — K219 Gastro-esophageal reflux disease without esophagitis: Secondary | ICD-10-CM

## 2023-06-06 LAB — CHLAM/NG PCR URINE
CHLAMYDIA TRACH PCR: NEGATIVE
NEISSERIA GONORROEAE PCR: NEGATIVE

## 2023-06-06 LAB — URINALYSIS MICROSCOPIC REFLEX TO CULTURE

## 2023-06-06 LAB — LIPASE: LIPASE: 14 U/L (ref 11–82)

## 2023-06-06 LAB — CBC AND DIFF
ABSOLUTE BASO COUNT: 0 10*3/uL (ref 0–0.20)
ABSOLUTE MONO COUNT: 0.3 10*3/uL (ref 0–0.80)
MDW (MONOCYTE DISTRIBUTION WIDTH): 16 (ref <20.7)
WBC COUNT: 5.7 10*3/uL (ref 4.5–11.0)

## 2023-06-06 LAB — URINALYSIS DIPSTICK REFLEX TO CULTURE
LEUKOCYTES: NEGATIVE MMOL/L (ref 21–30)
NITRITE: NEGATIVE U/L (ref 7–40)
URINE ASCORBIC ACID, UA: NEGATIVE U/L (ref 7–56)
URINE BILE: NEGATIVE mg/dL (ref 0.2–1.3)
URINE BLOOD: NEGATIVE g/dL — ABNORMAL HIGH (ref 3.5–5.0)
URINE KETONE: NEGATIVE g/dL (ref 6.0–8.0)

## 2023-06-06 LAB — COMPREHENSIVE METABOLIC PANEL
ANION GAP: 12 10*3/uL (ref 3–12)
EGFR: >60 mL/min (ref >60)
SODIUM: 138 MMOL/L (ref 137–147)

## 2023-06-06 LAB — POC CREATININE, RAD: CREATININE, POC: 0.9 mg/dL (ref 0.4–1.24)

## 2023-06-06 MED ORDER — IOHEXOL 350 MG IODINE/ML IV SOLN
100 mL | Freq: Once | INTRAVENOUS | 0 refills | Status: CP
Start: 2023-06-06 — End: ?
  Administered 2023-06-06: 08:00:00 100 mL via INTRAVENOUS

## 2023-06-06 MED ORDER — ONDANSETRON HCL (PF) 4 MG/2 ML IJ SOLN
4 mg | Freq: Once | INTRAVENOUS | 0 refills | Status: CP
Start: 2023-06-06 — End: ?
  Administered 2023-06-06: 08:00:00 4 mg via INTRAVENOUS

## 2023-06-06 MED ORDER — MAGNESIUM CITRATE PO SOLN
296 mL | Freq: Once | ORAL | 0 refills | Status: CP
Start: 2023-06-06 — End: ?
  Administered 2023-06-06: 12:00:00 296 mL via ORAL

## 2023-06-06 MED ORDER — SODIUM CHLORIDE 0.9 % IJ SOLN
50 mL | Freq: Once | INTRAVENOUS | 0 refills | Status: CP
Start: 2023-06-06 — End: ?
  Administered 2023-06-06: 08:00:00 50 mL via INTRAVENOUS

## 2023-06-06 NOTE — ED Notes
Pt is a 27 year old male who presents to the ED for a chief complaint of left sided abdominal pain, nausea, and constipation. Pt reports his last normal BM was 3 weeks ago. He states he recently started noticing blood when wipes after attempting to have a BM.  Pt denies taking any medications for improvement. Pt also reports having urinary frequency, urgency, and right flank pain for the past few weeks. Pt denies any fevers, chills, cough, SOB, chest pain, melena, hematochezia, or hematuria.     Pt is alert and oriented x4. GCS 15. Respirations are even and non labored with equal chest rise and fall. Airway is patent. Pt speaking in clear full sentences. TTP to the LUQ and LLQ. Otherwise abd is soft with no guarding, rigidity, or distention. No CVA tenderness. Skin is warm, dry, and appropriate for race.     Past Medical History:   Diagnosis Date    Acid reflux     Hypertension

## 2023-06-06 NOTE — ED Provider Notes
Chief Complaint   Patient presents with    Abdominal pain     Constipation x the last few wks. Still having flatulence. States there is blood on his toilet paper when he wipes. Endorses abdominal pain starting today. Denies fever or hx of abdominal surgeries.        History of Present Illness:  Paul Hale is a 27 year old male with a past medical history of hypertension and acid reflux who presents to the ED for constipation.  He states that he has been constipated for the past 3 weeks and has tried MiraLAX, fiber, prune juice and other medications to minimal avail.  He says that he has had some bowel movements recently but they have been pellet-like and minimal in quantity.  He also has noticed some blood in his stool recently.  He says he has had some increased urinary frequency.  He has also had some pain with ejaculation over the past few months.  He is monogamous and his sexual encounters with his wife and has not noticed any discharge or rash in the genital region.        Review of Systems:  Review of Systems   All other systems reviewed and are negative.      Allergies:  Patient has no known allergies.    Past Medical History:  Past Medical History:   Diagnosis Date    Acid reflux     Hypertension        Past Surgical History:  Surgical History:   Procedure Laterality Date    HX SEPTOPLASTY  07/2016    ESOPHAGOGASTRODUODENOSCOPY WITH BIOPSY - FLEXIBLE N/A 02/04/2021    Performed by Onnie Boer, MD at [facility] OR       Pertinent medical and surgical history reviewed.    Social History:  Social History     Tobacco Use    Smoking status: Former     Current packs/day: 0.00     Types: Cigarettes     Quit date: 03/05/2019     Years since quitting: 4.2    Smokeless tobacco: Never   Vaping Use    Vaping status: Never Used   Substance Use Topics    Alcohol use: Yes     Alcohol/week: 1.0 - 2.0 standard drink of alcohol     Types: 1 - 2 Cans of beer per week    Drug use: No     Social History     Substance and Sexual Activity   Drug Use No             Family History:  Family History   Problem Relation Name Age of Onset    Hypertension Mother      Hypertension Father      Diabetes Father      Heart Disease Father         Vitals:  ED Vitals      Date and Time T BP P RR SPO2P SPO2 User   06/06/23 0300 -- 136/61 58 -- -- 96 % JG   06/06/23 0230 -- 130/77 63 -- -- 97 % KM   06/06/23 0223 -- 135/68 77 -- -- 98 % KM   06/06/23 0105 36.4 ?C (97.5 ?F) 144/99 82 18 PER MINUTE -- 99 % TD            Physical Exam:  Physical Exam  Constitutional:       General: He is not in acute distress.     Appearance:  Normal appearance. He is not ill-appearing.   HENT:      Head: Normocephalic and atraumatic.      Mouth/Throat:      Mouth: Mucous membranes are moist.      Pharynx: Oropharynx is clear.   Eyes:      Extraocular Movements: Extraocular movements intact.      Conjunctiva/sclera: Conjunctivae normal.      Pupils: Pupils are equal, round, and reactive to light.   Cardiovascular:      Rate and Rhythm: Normal rate and regular rhythm.      Heart sounds: Normal heart sounds. No murmur heard.     No friction rub. No gallop.   Pulmonary:      Effort: No respiratory distress.      Breath sounds: Normal breath sounds. No stridor. No wheezing.   Abdominal:      General: There is no distension.      Palpations: Abdomen is soft. There is no mass.      Tenderness: There is abdominal tenderness.      Comments: Tenderness in the suprapubic and right lower quadrant   Musculoskeletal:         General: No swelling, tenderness or deformity. Normal range of motion.      Cervical back: Normal range of motion and neck supple. No rigidity or tenderness.   Lymphadenopathy:      Cervical: No cervical adenopathy.   Skin:     General: Skin is warm.      Coloration: Skin is not jaundiced.   Neurological:      General: No focal deficit present.      Mental Status: He is alert and oriented to person, place, and time.      Cranial Nerves: No cranial nerve deficit. Sensory: No sensory deficit.      Motor: No weakness.         Laboratory Results:  Labs Reviewed   COMPREHENSIVE METABOLIC PANEL - Abnormal       Result Value Ref Range Status    Sodium 138  137 - 147 MMOL/L Final    Potassium 3.7  3.5 - 5.1 MMOL/L Final    Chloride 104  98 - 110 MMOL/L Final    Glucose 110 (*) 70 - 100 MG/DL Final    Blood Urea Nitrogen 14  7 - 25 MG/DL Final    Creatinine 8.41  0.4 - 1.24 MG/DL Final    Calcium 9.9  8.5 - 10.6 MG/DL Final    Total Protein 7.9  6.0 - 8.0 G/DL Final    Total Bilirubin 0.7  0.2 - 1.3 MG/DL Final    Albumin 5.1 (*) 3.5 - 5.0 G/DL Final    Alk Phosphatase 65  25 - 110 U/L Final    AST (SGOT) 21  7 - 40 U/L Final    CO2 22  21 - 30 MMOL/L Final    ALT (SGPT) 17  7 - 56 U/L Final    Anion Gap 12  3 - 12 Final    eGFR >60  >60 mL/min Final   CBC AND DIFF    White Blood Cells 5.7  4.5 - 11.0 K/UL Final    RBC 4.60  4.4 - 5.5 M/UL Final    Hemoglobin 14.0  13.5 - 16.5 GM/DL Final    Hematocrit 32.4  40 - 50 % Final    MCV 87.1  80 - 100 FL Final    MCH 30.4  26 - 34  PG Final    MCHC 34.9  32.0 - 36.0 G/DL Final    RDW 16.1  11 - 15 % Final    Platelet Count 280  150 - 400 K/UL Final    MPV 8.3  7 - 11 FL Final    Neutrophils 58  41 - 77 % Final    Lymphocytes 33  24 - 44 % Final    Monocytes 7  4 - 12 % Final    Eosinophils 1  0 - 5 % Final    Basophils 1  0 - 2 % Final    Absolute Neutrophil Count 3.37  1.8 - 7.0 K/UL Final    Absolute Lymph Count 1.86  1.0 - 4.8 K/UL Final    Absolute Monocyte Count 0.39  0 - 0.80 K/UL Final    Absolute Eosinophil Count 0.04  0 - 0.45 K/UL Final    Absolute Basophil Count 0.03  0 - 0.20 K/UL Final    MDW (Monocyte Distribution Width) 16.3  <20.7 Final   URINALYSIS DIPSTICK REFLEX TO CULTURE    Color,UA YELLOW   Final    Turbidity,UA CLEAR  CLEAR-CLEAR Final    Specific Gravity-Urine 1.016  1.005 - 1.030 Final    pH,UA 6.0  5.0 - 8.0 Final    Protein,UA NEG  NEG-NEG Final    Glucose,UA NEG  NEG-NEG Final    Ketones,UA NEG  NEG-NEG Final Bilirubin,UA NEG  NEG-NEG Final    Blood,UA NEG  NEG-NEG Final    Urobilinogen,UA NORMAL  NORM-NORMAL Final    Nitrite,UA NEG  NEG-NEG Final    Leukocytes,UA NEG  NEG-NEG Final    Urine Ascorbic Acid, UA NEG  NEG-NEG Final   URINALYSIS MICROSCOPIC REFLEX TO CULTURE    WBCs,UA 0-2  0 - 2 /HPF Final    RBCs,UA 0-2  0 - 3 /HPF Final    Comment,UA     Final    Value: Criteria for reflex to culture are WBC>10, Positive Nitrite, and/or >=+1   leukocytes. If quantity is not sufficient, an addendum will follow.      UA Reflex Specimen Type and Source     Final    Value: URINE  MIDSTREAM      MucousUA TRACE   Final   LIPASE    Lipase 14  11 - 82 U/L Final   POC CREATININE, RAD    Creatinine, POC 0.9  0.4 - 1.24 MG/DL Final   UA GREY TOP TUBE   CLEAR TOP EXTRA URINE TUBE   CHLAM/NG PCR URINE          Radiology Interpretation:  CT ABD/PELV W CONTRAST   Final Result      1. No acute findings by CT.   2. Mild distal colonic diverticulosis.        By my electronic signature, I attest that I have personally reviewed the images for this examination and formulated the interpretations and opinions expressed in this report          Finalized by Queen Slough, MD on 06/06/2023 3:48 AM. Dictated by Jerene Pitch, MD on 06/06/2023 3:39 AM.             EKG:      Medical Decision Making:  Giovann Salahuddin is a 27 y.o. male who presents with chief complaint as listed above. Based on the history and presentation, the list of differential diagnoses considered included, but was not limited to, UTI, prostatitis, appendicitis, cholecystitis.  ED Course  27 y.o. male presented to the ED for abdominal pain.     Patient was evaluated by a resident and an attending physician. Available records were reviewed. Vitals were noted as above.    Initial labs and imaging were obtained to assess for the above-mentioned differentials. Pertinent findings included no UTI, no leukocytosis, no AKI, normal lipase, no sterile pyuria.  CT scan of the abdomen showed no acute intra-abdominal pathologies.  Urine chlamydia and gonorrhea were pending.  Patient was given Zofran for symptomatic relief.  He was also given magnesium citrate to drink so that it would help his constipation.  He was stable to be discharged home at this time.    Obtained data including lab tests and ECGs were independently reviewed.     He was advised to return to the ED if he has any worsening abdominal pain.  He was advised to follow-up with his PCP to ensure resolution of symptoms.    On re-evaluation, patient is in no apparent distress.     Findings discussed with patient along with the plan for discharge. Patient understands and agrees with the plan. Patient was reassessed and had appropriate vital signs and symptom control for discharge. We provided the patient with discharge information, strict return precautions, and follow up instructions that include, but not limited to, follow-up with PCP and/or specialist or referral clinic. Patient verbalized understanding. All questions were answered prior to discharge.         Complexity of Problems Addressed  Patient's active diagnoses as well as contributing pre-existing medical problems include:  Clinical Impression   Abdominal pain, unspecified abdominal location   Constipation, unspecified constipation type          Additional data reviewed:    History was obtained from an independent historian: Not in addition to what is mentioned above  Prior non-ED notes reviewed: Not in addition to what is mentioned above  Independent interpretation of diagnostic tests was performed by me: Not in addition to what is mentioned above  Patient presentation/management was discussed with the following qualified health care professionals and/or other relevant professionals: Not in addition to what is mentioned above    Risk evaluation:    Diagnosis or treatment of patient condition impacted by social determinant of health: None  Tests Considered but not performed due to clinical scoring (if not mentioned in ED course, aside from what is implied by clinical scores listed): None  Rationale regarding whether admission or escalation of care considered if not performed (if not mentioned in ED course, aside from what is implied by clinical scores listed): None    ED Scoring:                                Facility Administered Meds:  Medications   ondansetron HCL (PF) (ZOFRAN (PF)) injection 4 mg (4 mg Intravenous Given 06/06/23 0307)   iohexoL (OMNIPAQUE-350) 350 mg/mL injection 100 mL (100 mL Intravenous Given 06/06/23 0311)   sodium chloride PF 0.9% injection 50 mL (50 mL Intravenous Given 06/06/23 0311)   magnesium citrate oral solution 296 mL (296 mL Oral Given 06/06/23 0648)       Clinical Impression:  Clinical Impression   Abdominal pain, unspecified abdominal location   Constipation, unspecified constipation type       Disposition/Follow up  ED Disposition     ED Disposition   Discharge  Kathyrn Drown, MD  [address]  [place] [address]  [phone]    Schedule an appointment as soon as possible for a visit in 1 week        Medications:  Discharge Medication List as of 06/06/2023  6:50 AM          Procedure Notes:  Procedures           Iran Planas, MD  PGY-2, Department of Emergency Medicine   [place]: [phone]  06/06/2023 6:53 AM

## 2023-06-06 NOTE — Unmapped
Thank you for allowing the physicians and staff of [facility] Emergency Department to participate in your healthcare.    You were seen in the ED for abdominal pain. A resident physician and attending physician performed a history and physical examination. Initial vital signs were taken and lab work was done. These showed no concerning findings at this time.  Your CT scan was negative for any acute pathologies.    You were given magnesium citrate to drink care for your constipation.    Please follow up with your primary care physician within one week to check for resolution of your symptoms.    Please return to the ED if you have any worsening symptoms.    Please follow up with the designated physician as instructed.  If you do not have a primary care physician, you need to establish care with one.  Ask your physician to obtain your records and go over all results in detail.  Some of the results provided to you today may be preliminary results and significant changes will be provided to you as necessary.  However, there may be incidental findings (such as a lung nodule on a chest x-ray) that will require follow up in the near future by you and your primary care physician.      If you received any narcotic pain medications or sedatives while in the ED, you should NOT drive and you should not drive/operate machinery for 24 hours or while on those medications.    If your blood pressure was over 130/90, you should see your doctor to get your blood pressure rechecked.  Your doctor may start medications to control your blood pressure.      If your blood sugar was elevated, you should see your doctor to get your blood sugar rechecked.  Your doctor may start medications to control your blood sugar.      If you were diagnosed with seizures, you may NOT drive for the next 6 months.  You should not drive, operate any heavy machinery, bathe or shower while home alone or with the bathroom door locked, swim alone, climb up on a ladder, use firearms, or do anything that would put you or others in danger should you have another seizure during that activity.  Please schedule an appointment for follow-up with both your primary care physician and with the department of neurology.    If you have a wound, we have done our best to clean and care for the injury.  There may be retained foreign bodies that could not be seen, found, or removed.  Watch for signs of infection (redness, warmth, swelling, discharge, fever) and return to the ER, or follow-up with your regular physician, if any of these occur.  Sutures on the face should be removed in 5-7 days or as otherwise instructed.  Sutures/staples on other areas of the body should be removed in 10-14 days or as otherwise instructed.  Do not put antibiotic ointment on wound adhesive/glue.  You may safely shower and cleanse your repaired wounds with soap and water after 24 hours, but do not soak wounds or get them wet for prolonged periods of time (no soaking bath or swimming).    Smoking, vaping or chewing tobacco is bad for your health.  Do not start using these products.  If you use any form of tobacco, it is highly recommended that you stop using these products.  You may need help to quit using tobacco products and  you should ask any of your health care providers about resources available to assist in quitting.       You may return to the ER at any time and for any health care concern that you believe is in need of emergent/urgent/timely evaluation.

## 2023-06-06 NOTE — ED Notes
Discharge instructions and follow up instructions discussed with patient. Pt verbalizes understanding; pt denies any questions or concerns at this time. PIV removed. Pt ambulated with steady gait out of department.

## 2023-06-08 ENCOUNTER — Encounter: Admit: 2023-06-08 | Discharge: 2023-06-08 | Payer: Medicaid Other

## 2023-06-10 ENCOUNTER — Encounter: Admit: 2023-06-10 | Discharge: 2023-06-10 | Payer: Medicaid Other

## 2023-06-10 NOTE — Telephone Encounter
ED Discharge Follow Up  Reached patient: No - sent MyChart message per protocol   Patient Date of Birth: 21-Aug-1996  Admission Information  Hospital Name : [facility]  ED Admission Date: 06/06/23   ED Discharge Date: 06/06/23   Admission Diagnosis: Abdominal pain  Discharge Diagnosis:   Abdominal pain, unspecified abdominal location   Constipation, unspecified constipation type   Hospital Services: Unplanned  Today's call is 4 (calendar) days post discharge    Medication Reconciliation  Changes to pre-ED visit medications? No  Were new prescriptions filled? N/A  Meds reviewed and reconciled? Yes    Current Outpatient Medications   Medication Instructions    acetaminophen (TYLENOL PO) Oral    fexofenadine (ALLEGRA) 180 mg, Oral, DAILY    fluticasone propionate (FLONASE ALLERGY RELIEF) 50 mcg/actuation nasal spray, suspension 2 sprays, Each Nostril, DAILY, Shake bottle gently before using.    hydrocortisone (PROCTOZONE-HC) 2.5 % rectal cream Topical, TWICE DAILY    hydrOXYzine HCL (ATARAX) 50 mg, Oral, THREE TIMES DAILY PRN    meclizine (TRAVEL-EASE (MECLIZINE)) 25 mg, Oral, THREE TIMES DAILY PRN    pantoprazole DR (PROTONIX) 20 mg tablet Oral, DAILY    propranoloL (INDERAL) 20 mg, Oral, TWICE DAILY    sertraline (ZOLOFT) 25 mg, Oral, DAILY    sildenafiL (VIAGRA) 50 mg, Oral, AS NEEDED    sucralfate (CARAFATE) 1 gram tablet TAKE 1 TABLET BY MOUTH 4 TIMES DAILY ON AN EMPTY STOMACH ( 1 HOUR BEFORE MEALS AND AT BEDTIME)           Scheduling Follow-up Appointment  Upcoming appointments: No future appointments.  When was patient?s last PCP visit: Visit date not found  PCP primary location: [facility]  PCP appointment scheduled? No, no future appt  Specialist appointment scheduled? No  Is assistance with transportation needed? No  MyChart message sent? Active in MyChart. MyChart message sent.  Artera text sent? No      ED Communication   Did patient call clinic prior to going to ED? No  Reason patient went to ED: Unable to obtain    Lavell Islam

## 2023-06-10 NOTE — Telephone Encounter
Noted.Will Follow As Needed.

## 2024-01-30 ENCOUNTER — Encounter: Admit: 2024-01-30 | Discharge: 2024-01-30 | Payer: MEDICAID

## 2024-03-28 ENCOUNTER — Encounter: Admit: 2024-03-28 | Discharge: 2024-03-28 | Payer: MEDICAID

## 2024-04-05 ENCOUNTER — Encounter: Admit: 2024-04-05 | Discharge: 2024-04-05 | Payer: MEDICAID

## 2024-04-05 ENCOUNTER — Ambulatory Visit: Admit: 2024-04-05 | Discharge: 2024-04-05 | Payer: MEDICAID

## 2024-10-26 ENCOUNTER — Encounter: Admit: 2024-10-26 | Discharge: 2024-10-26 | Payer: MEDICAID
